# Patient Record
Sex: Male | Born: 1935 | Race: White | Hispanic: No | Marital: Married
Health system: Southern US, Community
[De-identification: ages and names within clinical notes are randomized; demographics above are authoritative.]

---

## 2013-01-22 ENCOUNTER — Emergency Department: Payer: Self-pay | Admitting: Emergency Medicine

## 2013-01-22 LAB — URINALYSIS, COMPLETE
Hyaline Cast: 2
Ketone: NEGATIVE
Leukocyte Esterase: NEGATIVE
Nitrite: NEGATIVE
Ph: 5 (ref 4.5–8.0)
Protein: NEGATIVE
RBC,UR: <1 /HPF (ref 0–5)
Squamous Epithelial: NONE SEEN
WBC UR: <1 /HPF (ref 0–5)

## 2013-01-22 LAB — BASIC METABOLIC PANEL
Anion Gap: 2 — ABNORMAL LOW (ref 7–16)
BUN: 26 mg/dL — ABNORMAL HIGH (ref 7–18)
Chloride: 105 mmol/L (ref 98–107)
Co2: 29 mmol/L (ref 21–32)
Creatinine: 1.23 mg/dL (ref 0.60–1.30)
EGFR (Non-African Amer.): 52 — ABNORMAL LOW
Glucose: 102 mg/dL — ABNORMAL HIGH (ref 65–99)
Osmolality: 277 (ref 275–301)
Potassium: 3.9 mmol/L (ref 3.5–5.1)
Sodium: 136 mmol/L (ref 136–145)

## 2013-01-22 LAB — CBC
HGB: 13 g/dL (ref 13.0–18.0)
MCV: 91 fL (ref 80–100)
Platelet: 180 10*3/uL (ref 150–440)
RDW: 13.5 % (ref 11.5–14.5)
WBC: 10.1 10*3/uL (ref 3.8–10.6)

## 2013-07-27 DEATH — deceased

## 2014-07-19 NOTE — Consult Note (Signed)
Brief Consult Note: Diagnosis: Cognitive diosrder NOS.   Patient was seen by consultant.   Recommend further assessment or treatment.   Orders entered.   Comments: Mr. Gabriel Walls has a h/o dementia. He was just placed at [facility]. He was brought to ER for agitated behavior and disrobing. He is plaeasntly confused here. No behavioral problems. He has a Comptrollersitter as he is getting out of bed and unsteady on his feet. He is unable to provide any information.   PLAN: 1. The patient reportedly has been already referred to another facility. SW consult to investigate.   2. Will continue his medications. Remeron may constipate him but he denies any discomfort.   3. We add Trazodone 50 mg tid for agitation.  Electronic Signatures: Kristine LineaPucilowska, Jolanta (MD)  (Signed 27-Oct-14 11:57)  Authored: Brief Consult Note   Last Updated: 27-Oct-14 11:57 by Kristine LineaPucilowska, Jolanta (MD)

## 2014-10-30 IMAGING — CR DG CHEST 2V
1 series · 2 of 2 positions shown · non-contrast
Comparison: none

REASON FOR EXAM: confusion
COMMENTS:

[Series 2: x chest ap · 0.14mm/px · 2 of 2 slices shown]
[im 1/2]
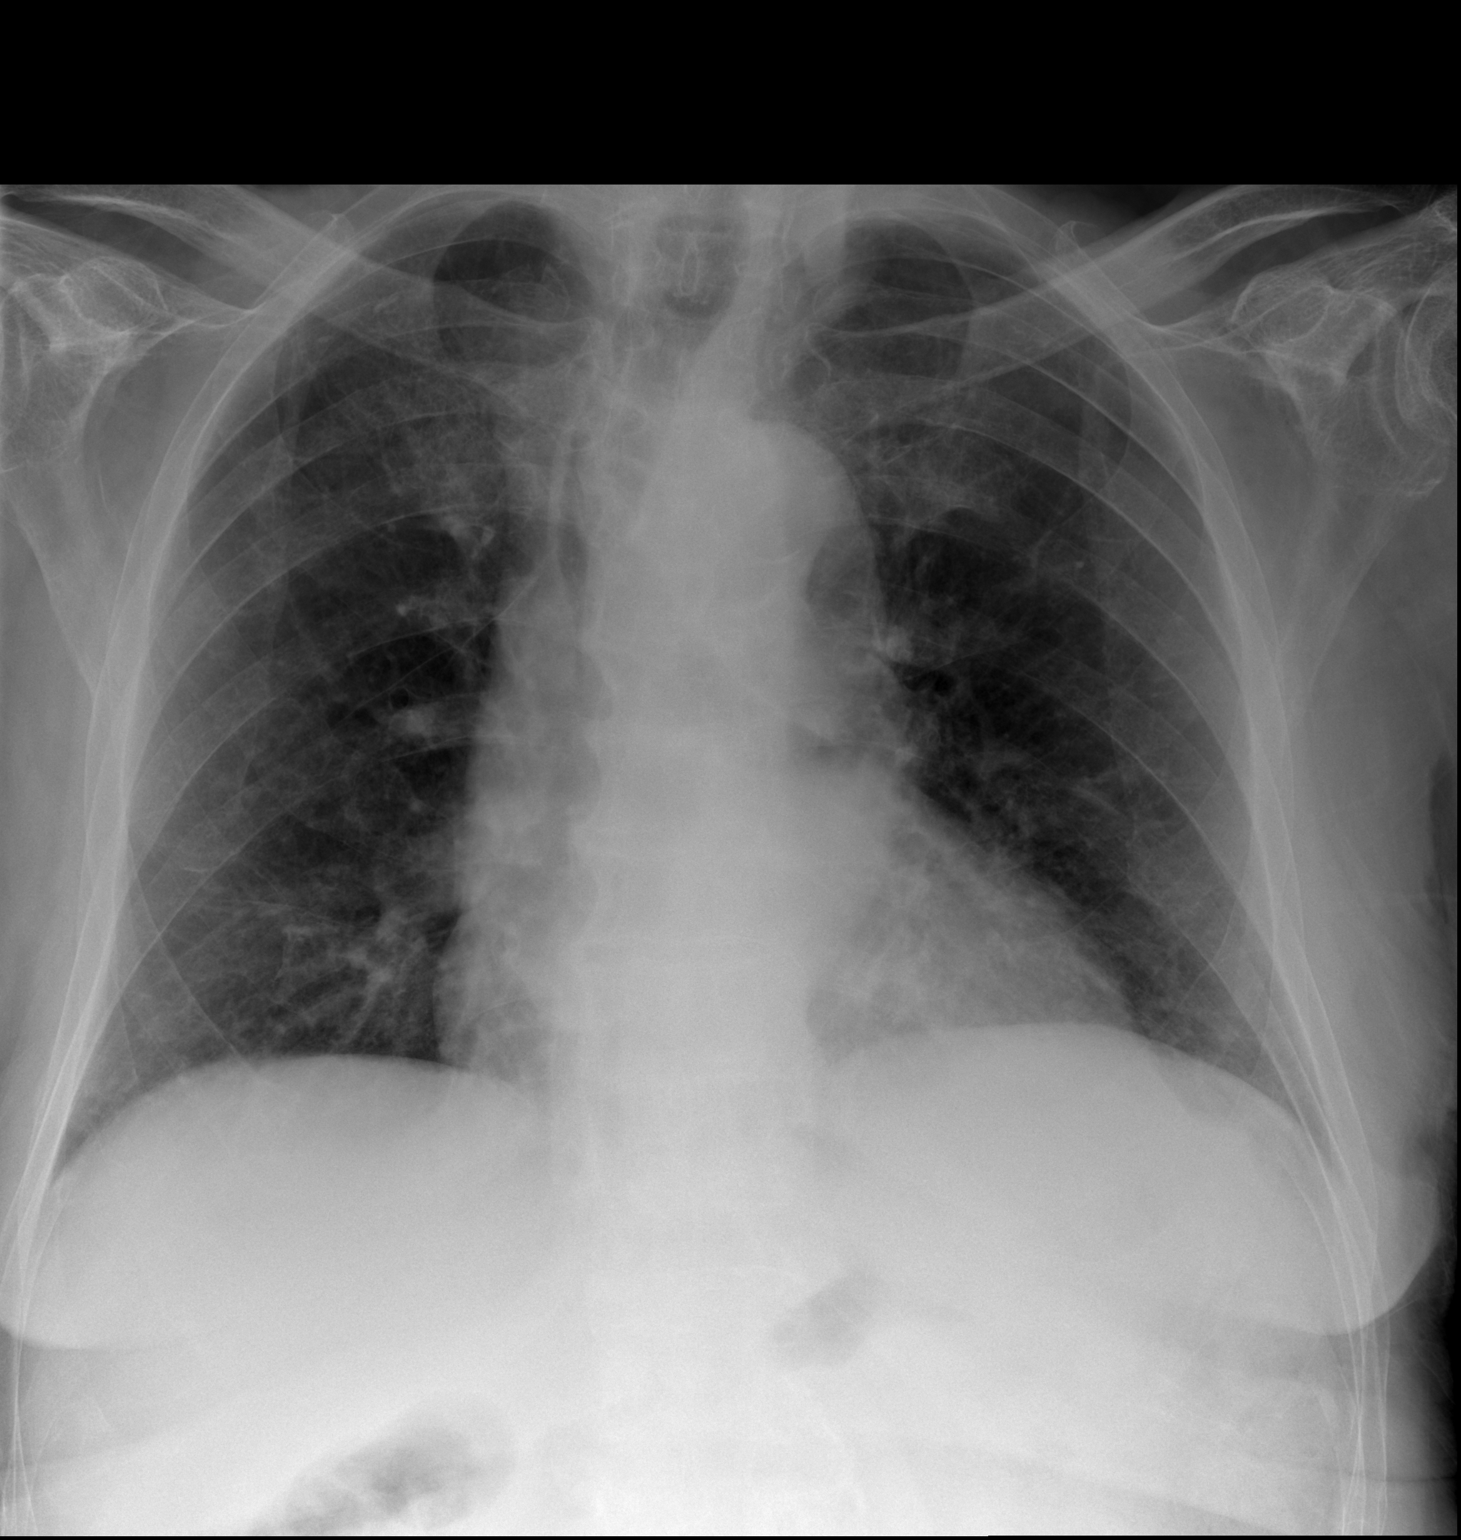
[im 2/2]
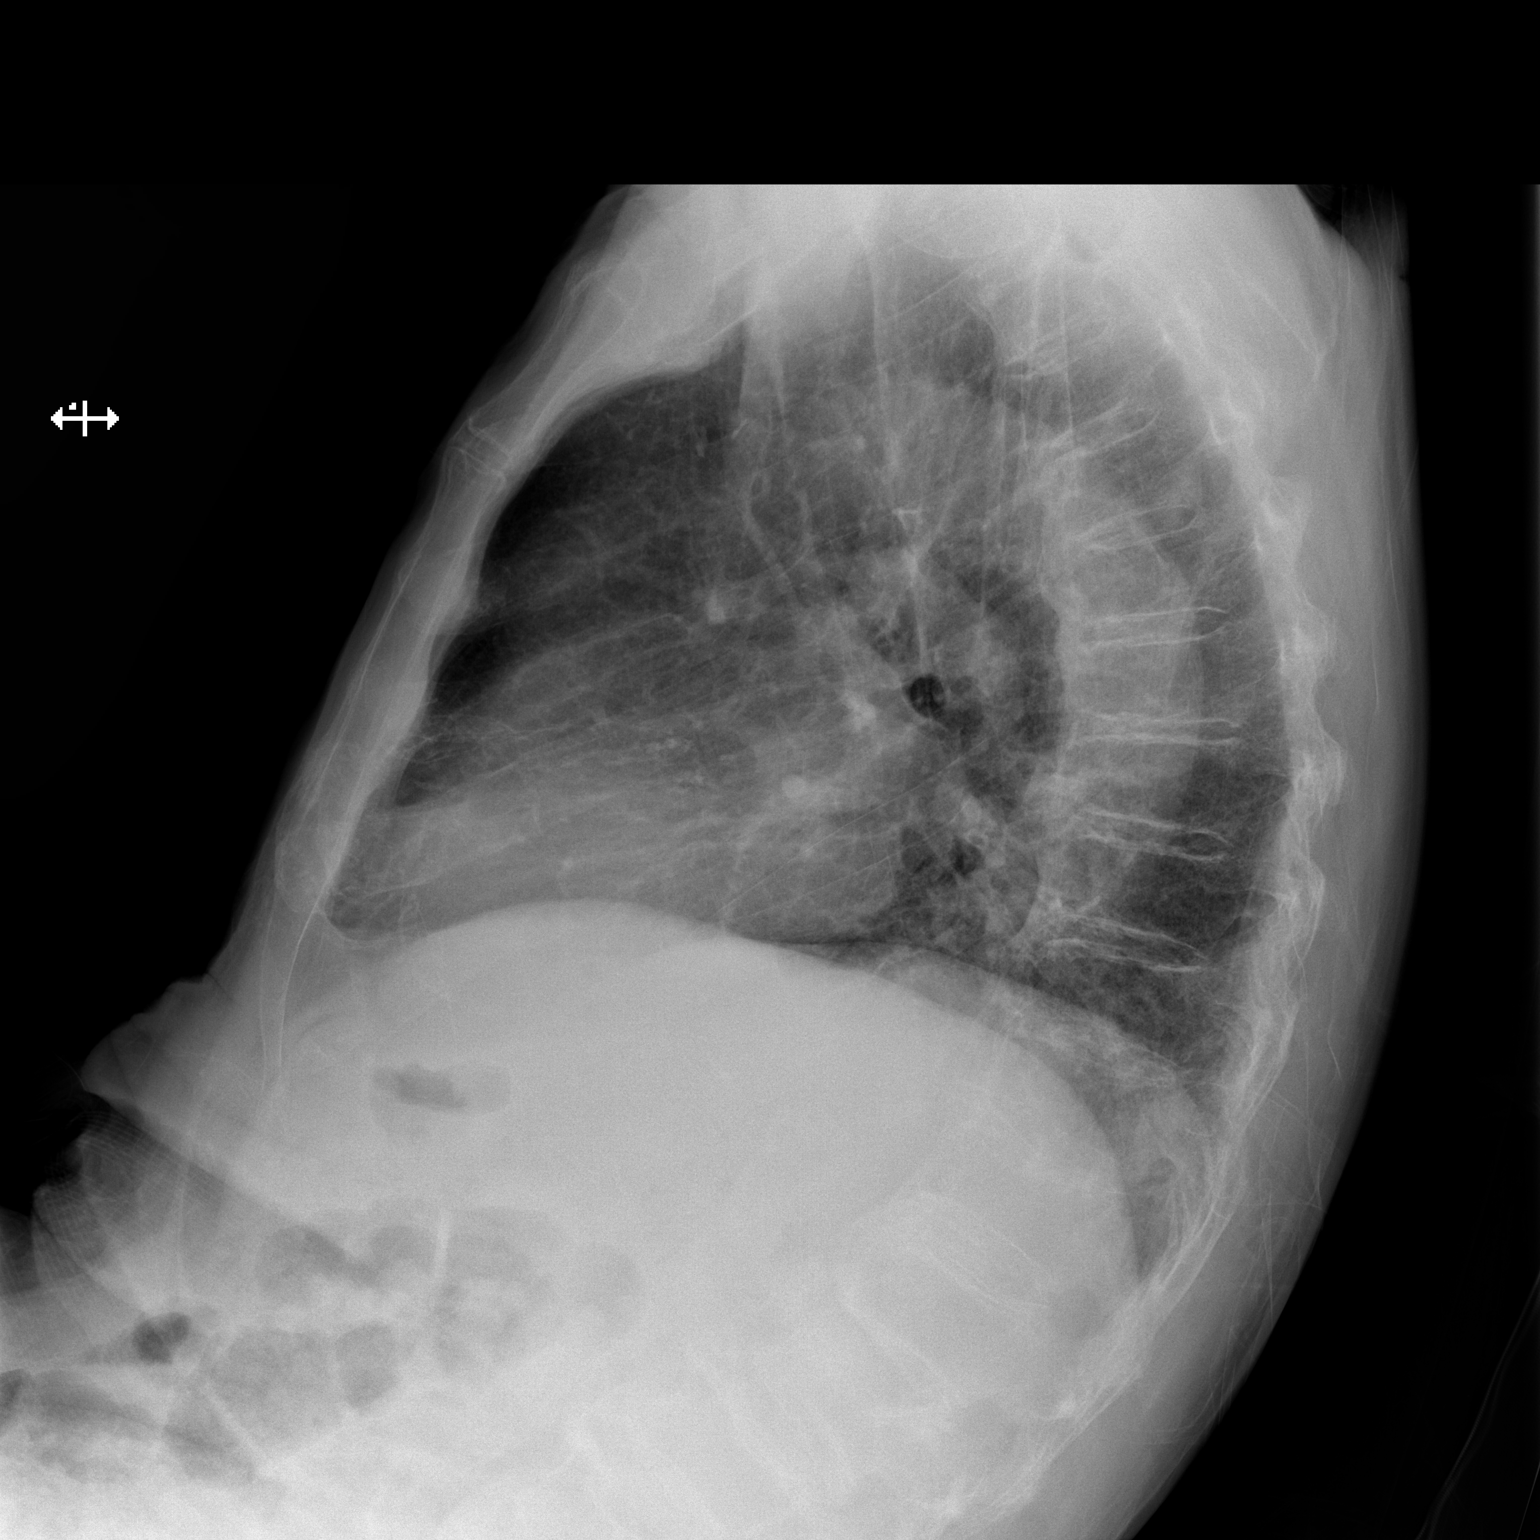

[2 of 2 positions shown; findings below may reference images not displayed]

PROCEDURE:     DXR - DXR CHEST PA (OR AP) AND LATERAL  - January 22, 2013  [DATE]

RESULT:     Multilevel degenerative changes are seen in the spine with
osteopenia and slight loss of height in multiple vertebral bodies which is
chronic. The lungs are clear. The cardiac silhouette and pulmonary
vasculature are normal.
IMPRESSION: No acute abnormality.

[REDACTED]
